# Patient Record
Sex: Female | Born: 1985 | Race: Black or African American | Hispanic: No | Marital: Single | Smoking: Never smoker
Health system: Southern US, Community
[De-identification: ages and names within clinical notes are randomized; demographics above are authoritative.]

## PROBLEM LIST (undated history)

## (undated) DIAGNOSIS — J45909 Unspecified asthma, uncomplicated: Secondary | ICD-10-CM

---

## 2013-07-05 ENCOUNTER — Encounter (HOSPITAL_COMMUNITY): Payer: Self-pay | Admitting: Emergency Medicine

## 2013-07-05 ENCOUNTER — Emergency Department (HOSPITAL_COMMUNITY)
Admission: EM | Admit: 2013-07-05 | Discharge: 2013-07-05 | Disposition: A | Discharge status: Home / Self Care | Payer: No Typology Code available for payment source | Attending: Emergency Medicine | Admitting: Emergency Medicine

## 2013-07-05 ENCOUNTER — Emergency Department (HOSPITAL_COMMUNITY): Payer: No Typology Code available for payment source

## 2013-07-05 DIAGNOSIS — Y92410 Unspecified street and highway as the place of occurrence of the external cause: Secondary | ICD-10-CM | POA: Insufficient documentation

## 2013-07-05 DIAGNOSIS — S1093XA Contusion of unspecified part of neck, initial encounter: Principal | ICD-10-CM

## 2013-07-05 DIAGNOSIS — Y9389 Activity, other specified: Secondary | ICD-10-CM | POA: Insufficient documentation

## 2013-07-05 DIAGNOSIS — J45909 Unspecified asthma, uncomplicated: Secondary | ICD-10-CM | POA: Insufficient documentation

## 2013-07-05 DIAGNOSIS — S0083XA Contusion of other part of head, initial encounter: Principal | ICD-10-CM | POA: Insufficient documentation

## 2013-07-05 DIAGNOSIS — S0003XA Contusion of scalp, initial encounter: Secondary | ICD-10-CM | POA: Insufficient documentation

## 2013-07-05 DIAGNOSIS — T148XXA Other injury of unspecified body region, initial encounter: Secondary | ICD-10-CM

## 2013-07-05 HISTORY — DX: Unspecified asthma, uncomplicated: J45.909

## 2013-07-05 MED ORDER — IBUPROFEN 600 MG PO TABS: 600.0000 mg | ORAL_TABLET | Freq: Four times a day (QID) | ORAL | Status: AC | PRN

## 2013-07-05 MED ORDER — ACETAMINOPHEN 325 MG PO TABS
650.0000 mg | ORAL_TABLET | Freq: Once | ORAL | Status: AC
  Administered 2013-07-05: 650 mg via ORAL
  Filled 2013-07-05: qty 2
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
## 2013-07-05 NOTE — ED Notes (Signed)
The pt was  Removed from the spineboard per the edps request

## 2013-07-05 NOTE — ED Notes (Signed)
Patient returned from CT
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
